# Patient Record
Sex: Female | Born: 2000 | Race: White | Hispanic: No | Marital: Single | Smoking: Never smoker
Health system: Southern US, Community
[De-identification: ages and names within clinical notes are randomized; demographics above are authoritative.]

---

## 2019-07-03 ENCOUNTER — Emergency Department
Admission: EM | Admit: 2019-07-03 | Discharge: 2019-07-04 | Disposition: A | Discharge status: Home / Self Care | Payer: BLUE CROSS/BLUE SHIELD | Attending: Emergency Medicine | Admitting: Emergency Medicine

## 2019-07-03 ENCOUNTER — Emergency Department: Payer: BLUE CROSS/BLUE SHIELD

## 2019-07-03 ENCOUNTER — Other Ambulatory Visit: Payer: Self-pay

## 2019-07-03 ENCOUNTER — Encounter: Payer: Self-pay | Admitting: Emergency Medicine

## 2019-07-03 DIAGNOSIS — R1031 Right lower quadrant pain: Secondary | ICD-10-CM | POA: Diagnosis present

## 2019-07-03 DIAGNOSIS — R102 Pelvic and perineal pain: Secondary | ICD-10-CM

## 2019-07-03 LAB — CBC
HCT: 42.3 % (ref 36.0–46.0)
Hemoglobin: 13.8 g/dL (ref 12.0–15.0)
MCH: 27.9 pg (ref 26.0–34.0)
MCHC: 32.6 g/dL (ref 30.0–36.0)
MCV: 85.5 fL (ref 80.0–100.0)
Platelets: 366 10*3/uL (ref 150–400)
RBC: 4.95 MIL/uL (ref 3.87–5.11)
RDW: 12.8 % (ref 11.5–15.5)
WBC: 11.4 10*3/uL — ABNORMAL HIGH (ref 4.0–10.5)
nRBC: 0 % (ref 0.0–0.2)

## 2019-07-03 LAB — URINALYSIS, COMPLETE (UACMP) WITH MICROSCOPIC
Bacteria, UA: NONE SEEN
Bilirubin Urine: NEGATIVE
Glucose, UA: NEGATIVE mg/dL
Ketones, ur: NEGATIVE mg/dL
Nitrite: NEGATIVE
Protein, ur: NEGATIVE mg/dL
Specific Gravity, Urine: 1.002 — ABNORMAL LOW (ref 1.005–1.030)
pH: 7 (ref 5.0–8.0)

## 2019-07-03 LAB — COMPREHENSIVE METABOLIC PANEL
ALT: 38 U/L (ref 0–44)
AST: 29 U/L (ref 15–41)
Albumin: 4.5 g/dL (ref 3.5–5.0)
Alkaline Phosphatase: 55 U/L (ref 38–126)
Anion gap: 10 (ref 5–15)
BUN: 14 mg/dL (ref 6–20)
CO2: 25 mmol/L (ref 22–32)
Calcium: 9.6 mg/dL (ref 8.9–10.3)
Chloride: 102 mmol/L (ref 98–111)
Creatinine, Ser: 0.54 mg/dL (ref 0.44–1.00)
GFR calc Af Amer: >60 mL/min (ref >60)
GFR calc non Af Amer: >60 mL/min (ref >60)
Glucose, Bld: 88 mg/dL (ref 70–99)
Potassium: 3.6 mmol/L (ref 3.5–5.1)
Sodium: 137 mmol/L (ref 135–145)
Total Bilirubin: 0.6 mg/dL (ref 0.3–1.2)
Total Protein: 8.1 g/dL (ref 6.5–8.1)

## 2019-07-03 LAB — POCT PREGNANCY, URINE: Preg Test, Ur: NEGATIVE

## 2019-07-03 LAB — LIPASE, BLOOD: Lipase: 20 U/L (ref 11–51)

## 2019-07-03 MED ORDER — SODIUM CHLORIDE 0.9 % IV BOLUS
1000.0000 mL | Freq: Once | INTRAVENOUS | Status: AC
  Administered 2019-07-03: 22:00:00 1000 mL via INTRAVENOUS

## 2019-07-03 MED ORDER — FENTANYL CITRATE (PF) 100 MCG/2ML IJ SOLN
50.0000 ug | Freq: Once | INTRAMUSCULAR | Status: AC
  Administered 2019-07-03: 50 ug via INTRAVENOUS
  Filled 2019-07-03: qty 2

## 2019-07-03 MED ORDER — DICYCLOMINE HCL 20 MG PO TABS: 20.0000 mg | ORAL_TABLET | Freq: Three times a day (TID) | ORAL | 0 refills | Status: AC | PRN

## 2019-07-03 MED ORDER — DICYCLOMINE HCL 10 MG PO CAPS
20.0000 mg | ORAL_CAPSULE | Freq: Once | ORAL | Status: AC
  Administered 2019-07-04: 20 mg via ORAL
  Filled 2019-07-03: qty 2

## 2019-07-03 NOTE — Discharge Instructions (Addendum)
Please seek medical attention for any high fevers, chest pain, shortness of breath, change in behavior, persistent vomiting, bloody stool or any other new or concerning symptoms.  

## 2019-07-03 NOTE — ED Notes (Signed)
Pt up to stat desk inquiring about wait time; ambulatory with steady gait; asked about leaving and going to another hospital because she's in so much pain; encouraged pt to stay....says she will for now
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
## 2019-07-03 NOTE — ED Triage Notes (Signed)
Pt arrives ambulatory to triage with RLQ pain since 0800 this AM. Pt states that she has had intermittent nausea with the sharp pain. Pt appears in NAD.

## 2019-07-03 NOTE — ED Notes (Signed)
Patient transported to Ultrasound 

## 2020-09-17 IMAGING — US US PELVIS COMPLETE WITH TRANSVAGINAL
1 series · 14 of 25 positions shown · non-contrast
Comparison: None

CLINICAL DATA: Right adnexal tenderness



[Series 1: us pelvis complete with transvaginal · 14 of 67 slices shown]
[im 1/67]
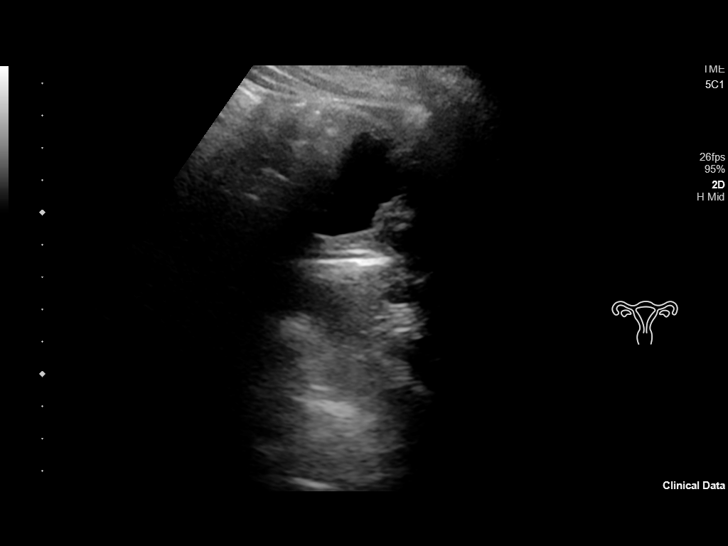
[im 6/67]
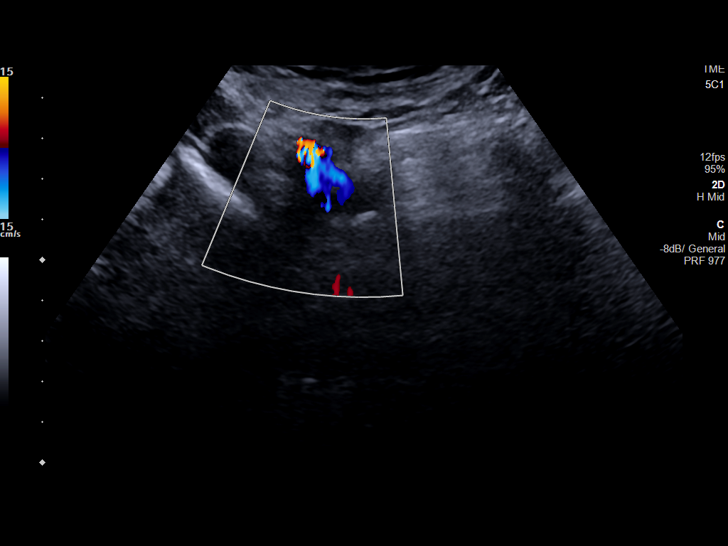
[im 12/67]
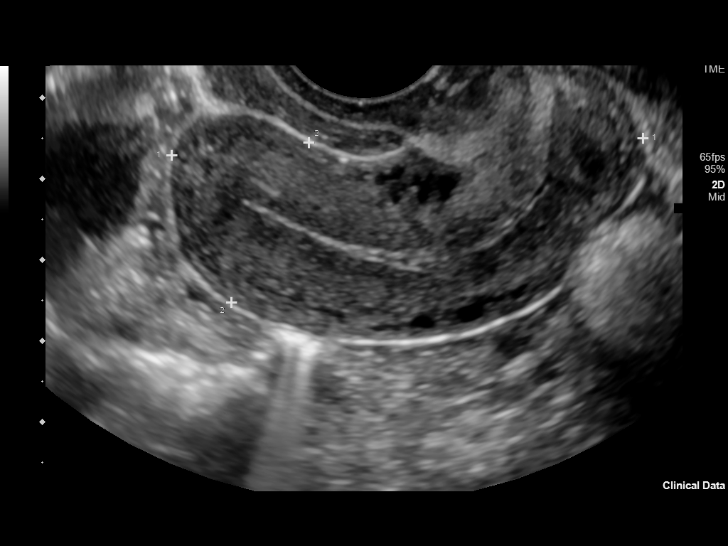
[im 17/67]
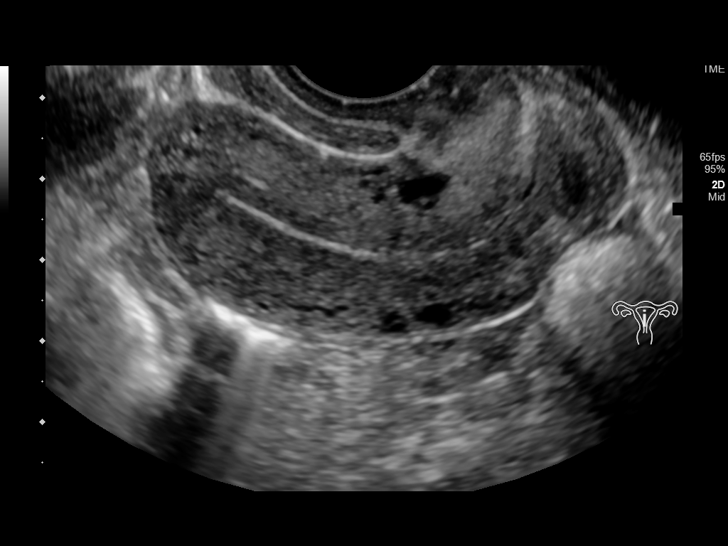
[im 23/67]
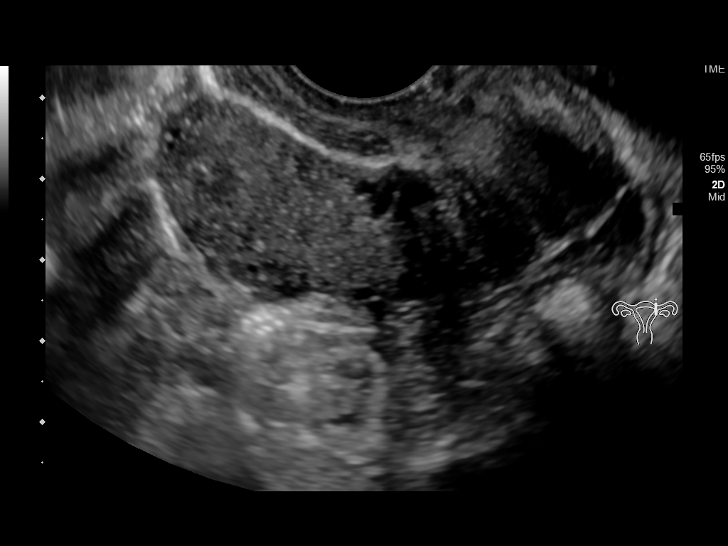
[im 25/67]
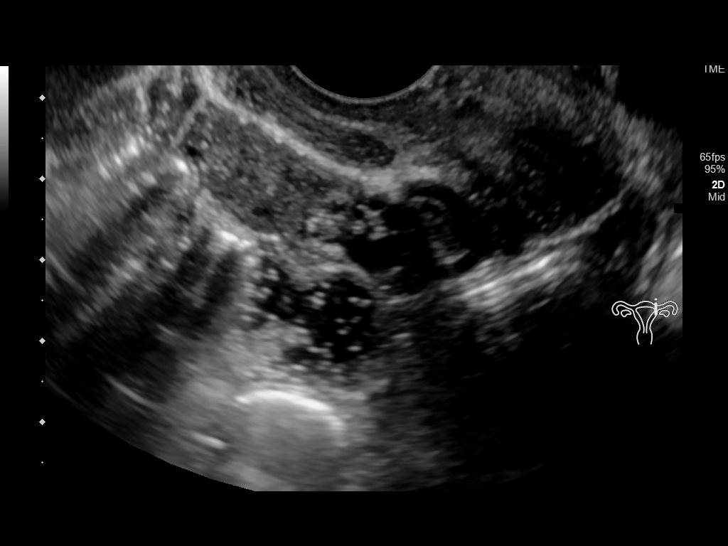
[im 31/67]
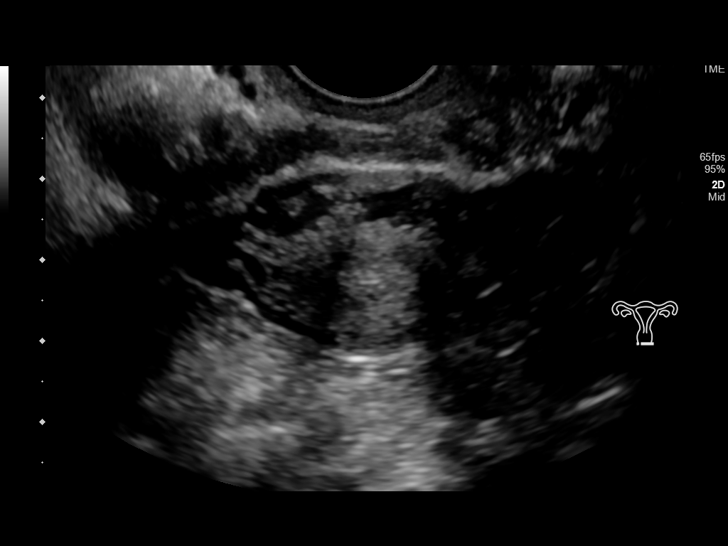
[im 36/67]
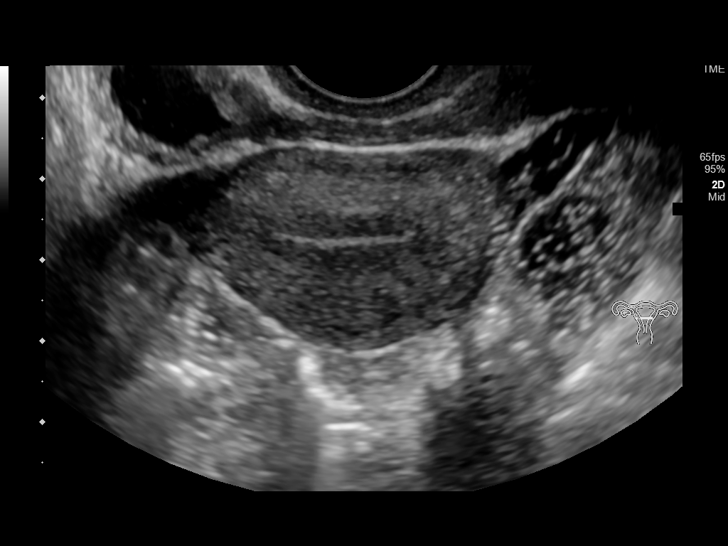
[im 42/67]
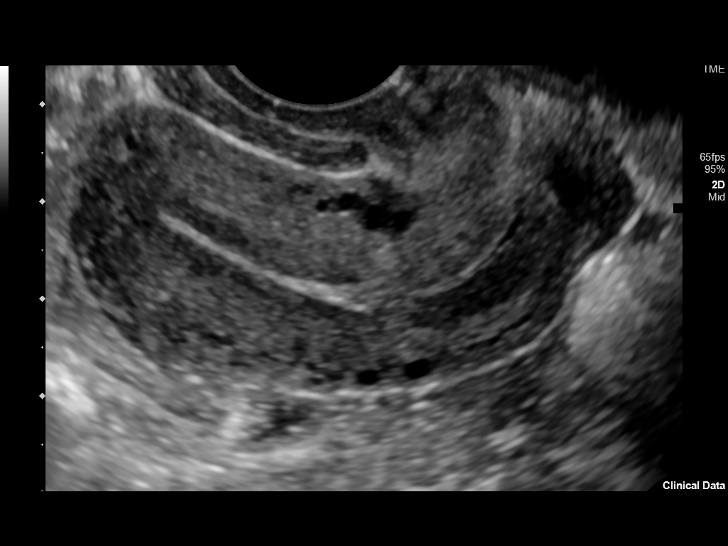
[im 45/67]
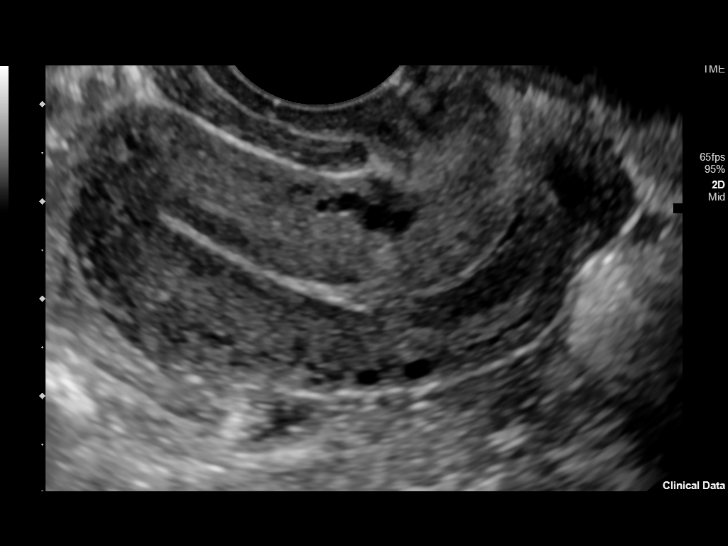
[im 50/67]
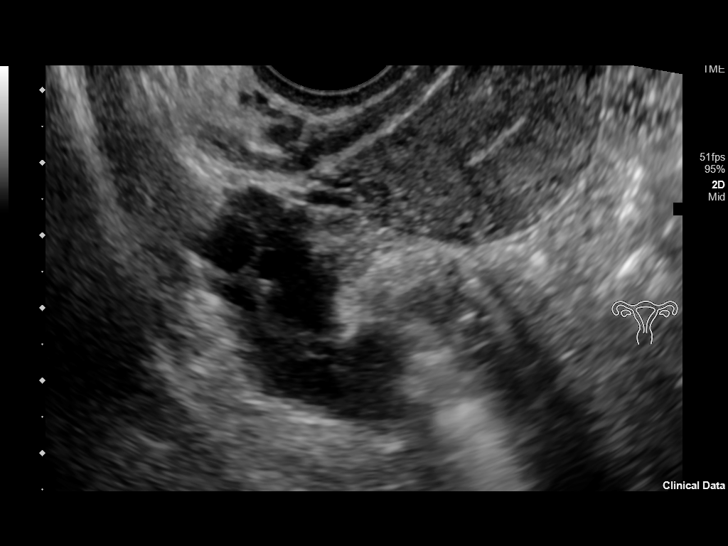
[im 56/67]
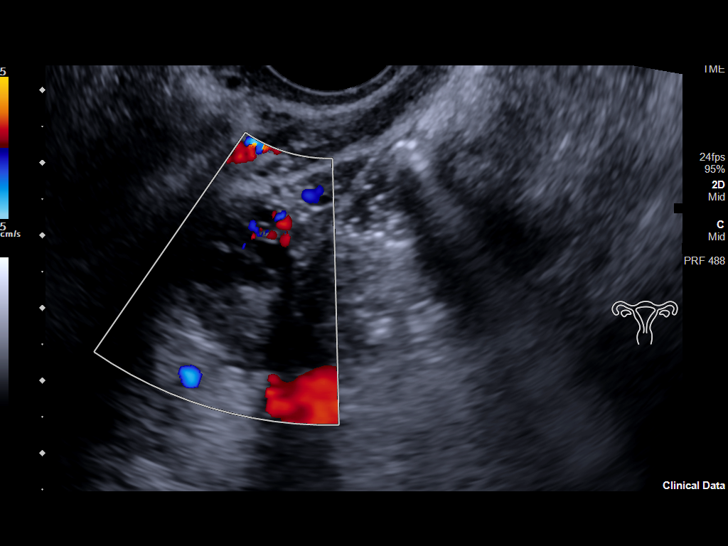
[im 61/67]
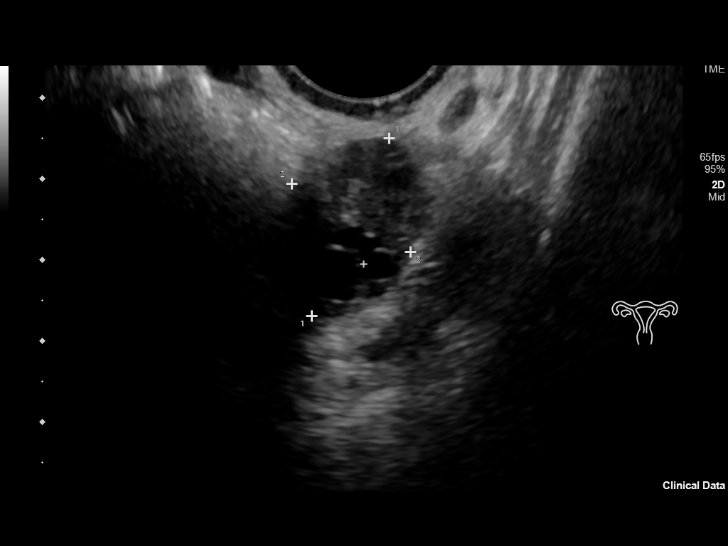
[im 67/67]
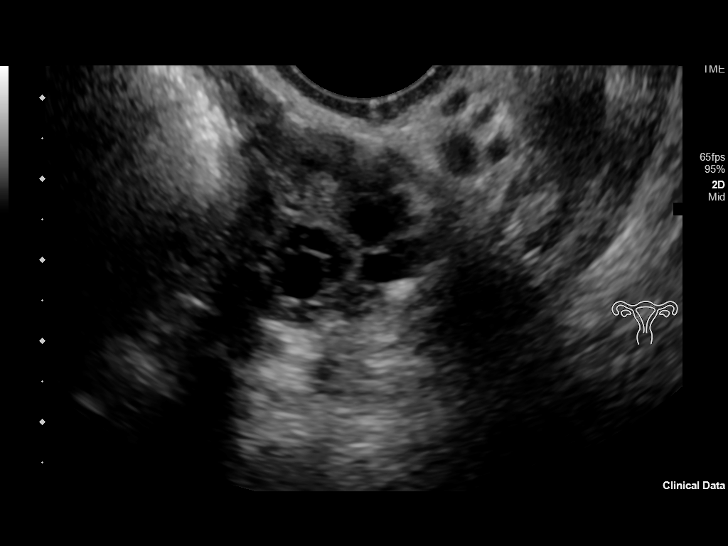

[14 of 25 positions shown; findings below may reference images not displayed]

FINDINGS: Uterus

Measurements: 5.8 x 2.2 x 3.8 cm = volume: 25 mL. No fibroids or
other mass visualized.

Endometrium

Thickness: 1 mm in thickness.  No focal abnormality visualized.

Right ovary

Measurements: 3.1 x 1.5 x 1.7 cm = volume: 4.1 mL. Normal
appearance/no adnexal mass.

Left ovary

Measurements: 2.4 x 1.7 x 2.4 cm = volume: 5.0. mL. Normal
appearance/no adnexal mass.

Other findings

No abnormal free fluid.
IMPRESSION: Normal study.
